# Patient Record
Sex: Male | Born: 1995 | Race: White | Hispanic: No | Marital: Single | Smoking: Never smoker
Health system: Southern US, Community
[De-identification: ages and names within clinical notes are randomized; demographics above are authoritative.]

## PROBLEM LIST (undated history)

## (undated) HISTORY — PX: WISDOM TOOTH EXTRACTION: SHX21

---
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
## 2016-02-07 ENCOUNTER — Emergency Department
Admission: EM | Admit: 2016-02-07 | Discharge: 2016-02-07 | Disposition: A | Discharge status: Home / Self Care | Payer: BLUE CROSS/BLUE SHIELD | Attending: Emergency Medicine | Admitting: Emergency Medicine

## 2016-02-07 ENCOUNTER — Encounter: Payer: Self-pay | Admitting: Emergency Medicine

## 2016-02-07 ENCOUNTER — Emergency Department: Payer: BLUE CROSS/BLUE SHIELD

## 2016-02-07 DIAGNOSIS — S9304XA Dislocation of right ankle joint, initial encounter: Secondary | ICD-10-CM | POA: Insufficient documentation

## 2016-02-07 DIAGNOSIS — Y9289 Other specified places as the place of occurrence of the external cause: Secondary | ICD-10-CM | POA: Diagnosis not present

## 2016-02-07 DIAGNOSIS — S99911A Unspecified injury of right ankle, initial encounter: Secondary | ICD-10-CM | POA: Diagnosis present

## 2016-02-07 DIAGNOSIS — Y998 Other external cause status: Secondary | ICD-10-CM | POA: Diagnosis not present

## 2016-02-07 DIAGNOSIS — W010XXA Fall on same level from slipping, tripping and stumbling without subsequent striking against object, initial encounter: Secondary | ICD-10-CM | POA: Diagnosis not present

## 2016-02-07 DIAGNOSIS — W19XXXA Unspecified fall, initial encounter: Secondary | ICD-10-CM

## 2016-02-07 DIAGNOSIS — Y9301 Activity, walking, marching and hiking: Secondary | ICD-10-CM | POA: Diagnosis not present

## 2016-02-07 MED ORDER — SODIUM CHLORIDE 0.9 % IV BOLUS (SEPSIS)
1000.0000 mL | Freq: Once | INTRAVENOUS | Status: AC
  Administered 2016-02-07: 1000 mL via INTRAVENOUS

## 2016-02-07 MED ORDER — KETAMINE HCL 10 MG/ML IJ SOLN
INTRAMUSCULAR | Status: AC
  Filled 2016-02-07: qty 1

## 2016-02-07 MED ORDER — KETAMINE HCL 10 MG/ML IJ SOLN
INTRAMUSCULAR | Status: AC | PRN
  Administered 2016-02-07: 100 mg via INTRAVENOUS

## 2016-02-07 MED ORDER — KETAMINE HCL 10 MG/ML IJ SOLN: 100.0000 mg | Freq: Once | INTRAMUSCULAR | Status: DC

## 2016-02-07 MED ORDER — NALOXONE HCL 2 MG/2ML IJ SOSY
PREFILLED_SYRINGE | INTRAMUSCULAR | Status: AC
  Filled 2016-02-07: qty 2

## 2016-02-07 NOTE — ED Notes (Signed)
Pt via POV with CO right ankle pain. Pt brought to room from XR.  obvious swelling and deformity to right ankle, foot bent medial at 45angle.  Pt calm and cooperative, 8/10 pain.  Denies ETOH and drug abuse.

## 2016-02-07 NOTE — ED Provider Notes (Signed)
[facility] Emergency Department Provider Note  ____________________________________________  Time seen: Seen upon arrival to the treatment area  I have reviewed the triage vital signs and the nursing notes.   HISTORY  Chief Complaint Ankle Pain    HPI LENELL LAMA is a 20 y.o. male without any chronic medical problems was presenting today with right ankle pain and deformity. He says he was walking down a hill when he tripped. He says that he then noticed the deformity was unable to walk. He came directly to the emergency department. He says there is pain only when he moves or attempts to move the ankle. He says that he is able to feel the foot and move his toes. He says last time he ate was 520.Denies any drinking or other substance abuse tonight.   No past medical history on file.  There are no active problems to display for this patient.   Past Surgical History  Procedure Laterality Date  . Wisdom tooth extraction      No current outpatient prescriptions on file.  Allergies Review of patient's allergies indicates no known allergies.  No family history on file.  Social History Social History  Substance Use Topics  . Smoking status: Never Smoker   . Smokeless tobacco: Not on file  . Alcohol Use: No    Review of Systems Constitutional: No fever/chills Eyes: No visual changes. ENT: No sore throat. Cardiovascular: Denies chest pain. Respiratory: Denies shortness of breath. Gastrointestinal: No abdominal pain.  No nausea, no vomiting.  No diarrhea.  No constipation. Genitourinary: Negative for dysuria. Musculoskeletal: Negative for back pain. Skin: Negative for rash. Neurological: Negative for headaches, focal weakness or numbness.  10-point ROS otherwise negative.  ____________________________________________   PHYSICAL EXAM:  VITAL SIGNS: ED Triage Vitals  Enc Vitals Group     BP 02/07/16 2002 148/79 mmHg     Pulse Rate  02/07/16 2002 51     Resp 02/07/16 2002 18     Temp 02/07/16 2002 98.4 F (36.9 C)     Temp Source 02/07/16 2002 Oral     SpO2 02/07/16 2002 100 %     Weight 02/07/16 2002 180 lb (81.647 kg)     Height 02/07/16 2002  (1.803 m)     Head Cir --      Peak Flow --      Pain Score 02/07/16 2004 8     Pain Loc --      Pain Edu? --      Excl. in GC? --     Constitutional: Alert and oriented. Well appearing and in no acute distress. Eyes: Conjunctivae are normal. PERRL. EOMI. Head: Atraumatic. Nose: No congestion/rhinnorhea. Mouth/Throat: Mucous membranes are moist.   Neck: No stridor.   Cardiovascular: Normal rate, regular rhythm. Grossly normal heart sounds.  Good peripheral circulation. Respiratory: Normal respiratory effort.  No retractions. Lungs CTAB. Gastrointestinal: Soft and nontender. No distention. No abdominal bruits. No CVA tenderness. Musculoskeletal: Obvious deformity to the right lower extremity with the lateral malleolus protruding greatly and the foot displaced medially to the tibia. There is a palpable dorsalis pedis pulse. He is sensate to light touch. He is able to range his toes fully. Neurologic:  Normal speech and language. No gross focal neurologic deficits are appreciated.  Skin:  Skin is warm, dry and intact. No rash noted. Psychiatric: Mood and affect are normal. Speech and behavior are normal.  ____________________________________________   LABS (all labs ordered are listed, but  only abnormal results are displayed)  Labs Reviewed - No data to display ____________________________________________  EKG   ____________________________________________  RADIOLOGY  DG Ankle Complete Right (Final result) Result time: 02/07/16 20:47:00   Final result by Rad Results In Interface (02/07/16 20:47:00)   Narrative:   CLINICAL DATA: Status post reduction  EXAM: RIGHT ANKLE - COMPLETE 3+ VIEW  COMPARISON: Films from earlier in the same  day  FINDINGS: Considerable soft tissue swelling is noted. There is been relocation of the talus into the navicular bone. No definitive fracture is seen. No other focal abnormality is noted.  IMPRESSION: Reduction of previously seen talus dislocation   Electronically Signed By: Alcide Clever M.D. On: 02/07/2016 20:47          DG Ankle Complete Right (Final result) Result time: 02/07/16 19:44:10   Final result by Rad Results In Interface (02/07/16 19:44:10)   Narrative:   CLINICAL DATA: Slipped and fell while walking down hill, with right ankle pain and deformity. Initial encounter.  EXAM: RIGHT ANKLE - COMPLETE 3+ VIEW  COMPARISON: None.  FINDINGS: There is complete dislocation distal to the talus, involving the talocalcaneal and talonavicular articulations. There is marked displacement of the foot medially, with outward rotation. No definite fracture is seen.  The ankle mortise is grossly unremarkable, though incompletely assessed. The interosseous space is preserved. Known soft disruption is not well characterized on radiograph.  IMPRESSION: Complete dislocation distal to the talus, involving the talocalcaneal and talonavicular articulations. Marked displacement of the foot medially, with outward rotation. No definite fracture characterized on radiograph.   Electronically Signed By: Roanna Raider M.D. On: 02/07/2016 19:43        ____________________________________________   PROCEDURES  Procedural sedation Performed by: Arelia Longest Consent: Verbal consent obtained. Risks and benefits: risks, benefits and alternatives were discussed Required items: required blood products, implants, devices, and special equipment available Patient identity confirmed: arm band and provided demographic data Time out: Immediately prior to procedure a "time out" was called to verify the correct patient, procedure, equipment, support staff and  site/side marked as required.  Sedation type: moderate (conscious) sedation NPO time confirmed and considedered  Sedatives: KETAMINE   Physician Time at Bedside:  Vitals: Vital signs were monitored during sedation. Cardiac Monitor, pulse oximeter Patient tolerance: Patient tolerated the procedure well with no immediate complications. Comments: Pt with uneventful recovered. Returned to pre-procedural sedation baseline  Reduction of dislocation Date/Time: 9:17 PM Performed by: Arelia Longest Authorized by: Arelia Longest Consent: Verbal consent obtained. Risks and benefits: risks, benefits and alternatives were discussed Consent given by: patient Required items: required blood products, implants, devices, and special equipment available Time out: Immediately prior to procedure a "time out" was called to verify the correct patient, procedure, equipment, support staff and site/side marked as required.  Patient sedated: With ketamine  Vitals: Vital signs were monitored during sedation. Patient tolerance: Patient tolerated the procedure well with no immediate complications. Joint: Right ankle Reduction technique: Gentle axial and lateral traction.      ____________________________________________   INITIAL IMPRESSION / ASSESSMENT AND PLAN / ED COURSE  Pertinent labs & imaging results that were available during my care of the patient were reviewed by me and considered in my medical decision making (see chart for details).  ----------------------------------------- 9:18 PM on 02/07/2016 -----------------------------------------  Patient now in posterior as well as U-splint. Neurovascularly intact with less than 2 second capillary refill as well as sensation to light touch. The patient is  able to range his toes fully. He says that his pain is greatly improved. He'll be discharged with crutches. He knows to follow up with  orthopedics. ____________________________________________   FINAL CLINICAL IMPRESSION(S) / ED DIAGNOSES  Mechanical fall. Right ankle dislocation.    Myrna Blazeravid Matthew Schaevitz, MD 02/07/16 2119

## 2016-02-07 NOTE — Discharge Instructions (Signed)
Ankle Dislocation °Ankle dislocation is the displacement of the bones that form your ankle joint. The ankle joint is designed for a balance of stability and flexibility. The bones of the ankle are held in place by very strong, fibrous tissues (ligaments) that connect the bones to each other. °CAUSES °Because the ankle is a very strong and stable joint, ankle dislocation is only caused by a very forceful injury. Typically, injuries that contribute to ankle dislocation include broken bones (fractures) on the inside and outside of the ankle (malleoli).  °RELATED COMPLICATIONS °Ankle dislocation can lead to more serious complications. Examples of complications associated with ankle dislocation include: °· Injury to the strong fibrous tissues that connect muscles to bones (tendons). °· Injury to the flexible tissue that cushions the bones in the joint (cartilage). This can lead to the development of arthritis, loss of joint motion, and pain. °· Injury to the nerves and blood vessels that cross the ankle. Blood vessel damage may result in bone death of the top bone of the foot (talus). °· Skin over the dislocated area being torn (lacerated) or damaged by pressure from the dislocated bones. °· Swelling of compartments in the foot (rare). This may damage blood supply to the muscles (compartment syndrome). °RISK FACTORS °Although dislocation of the ankle can occur in anyone, some people are at greater risk than others. People at increased risk of ankle dislocation include: °· Young males. This may be related to their overall increased risk of injury. °· Postmenopausal women. This may be related to their increased risk of bone fracture because of the weakening of the bones that occurs in women in this age group (osteoporosis). °· People born with greater looseness (elasticity) in their ligaments. °SYMPTOMS °Symptoms of ankle dislocation include: °· Severe pain. °· Swelling. °· Deformity around the ankle. °· Whitening or  laceration of the skin. °DIAGNOSIS  °A physical exam and an X-ray exam are usually done to help your caregiver diagnose ankle dislocation. °TREATMENT °Treatment may include: °· Manipulation of the ankle by your caregiver to put your ankle back in place (reduction). °· Repair of any associated skin lacerations. °· Plates and screws used to stabilize the fractures and hold the joint in position after reduction. °· Pins drilled into your bones that are connected to bars outside of your skin (external fixator) used to hold your ankle in a fixed position until the swelling in your ankle goes down enough for surgery to be done. °· Placement of a cast or splint to allow torn ligaments to heal. °· Physical therapy to regain ankle motion and leg strength. °HOME CARE INSTRUCTIONS °The following measures can help to reduce pain and hasten the healing process: °· Rest your injured joint. Do not move it. Avoid activities similar to the one that caused your injury. °· Apply ice to your injured joint for 1 to 2 days after your reduction or as directed by your caregiver. Applying ice helps to reduce inflammation and pain. °¨ Put ice in a plastic bag. °¨ Place a towel between your skin and the bag. °¨ Leave the ice on for 15 to 20 minutes at a time, every couple of hours while you are awake. °· Elevate your ankle above your heart to minimize swelling. °· Move your toes as instructed by your caregiver to prevent stiffness. °· Take over-the-counter or prescription medicines for pain as directed by your caregiver. °SEEK IMMEDIATE MEDICAL CARE IF: °· Your cast, splint, screws, plates, or external fixator becomes loose or damaged. °·   You have an external fixator and you notice fluids draining around the pins. °· Your pain becomes worse rather than better. °· You lose feeling in your toe or cannot bend the tip of your toe. °MAKE SURE YOU: °· Understand these instructions. °· Will watch your condition. °· Will get help right away if you  are not doing well or get worse. °  °This information is not intended to replace advice given to you by your health care provider. Make sure you discuss any questions you have with your health care provider. °  °Document Released: 11/09/2005 Document Revised: 11/30/2014 Document Reviewed: 06/11/2015 °[employer] Interactive Patient Education ©2016 [employer] ° °

## 2019-10-13 ENCOUNTER — Other Ambulatory Visit: Payer: Self-pay

## 2019-10-13 DIAGNOSIS — Z20822 Contact with and (suspected) exposure to covid-19: Secondary | ICD-10-CM

## 2019-10-15 ENCOUNTER — Telehealth: Payer: Self-pay

## 2019-10-15 NOTE — Telephone Encounter (Signed)
Pt called for covid test results. Advised results are not back.  

## 2019-10-16 LAB — NOVEL CORONAVIRUS, NAA: SARS-CoV-2, NAA: NOT DETECTED

## 2021-10-31 ENCOUNTER — Emergency Department: Payer: BC Managed Care – PPO

## 2021-10-31 ENCOUNTER — Emergency Department
Admission: EM | Admit: 2021-10-31 | Discharge: 2021-11-01 | Disposition: A | Discharge status: Home / Self Care | Payer: BC Managed Care – PPO | Attending: Emergency Medicine | Admitting: Emergency Medicine

## 2021-10-31 ENCOUNTER — Other Ambulatory Visit: Payer: Self-pay

## 2021-10-31 DIAGNOSIS — S4991XA Unspecified injury of right shoulder and upper arm, initial encounter: Secondary | ICD-10-CM | POA: Diagnosis present

## 2021-10-31 DIAGNOSIS — Y900 Blood alcohol level of less than 20 mg/100 ml: Secondary | ICD-10-CM | POA: Insufficient documentation

## 2021-10-31 DIAGNOSIS — S060XAA Concussion with loss of consciousness status unknown, initial encounter: Secondary | ICD-10-CM | POA: Diagnosis not present

## 2021-10-31 DIAGNOSIS — Z23 Encounter for immunization: Secondary | ICD-10-CM | POA: Diagnosis not present

## 2021-10-31 DIAGNOSIS — T1490XA Injury, unspecified, initial encounter: Secondary | ICD-10-CM

## 2021-10-31 DIAGNOSIS — S40211A Abrasion of right shoulder, initial encounter: Secondary | ICD-10-CM | POA: Diagnosis not present

## 2021-10-31 LAB — CBC
HCT: 39.4 % (ref 39.0–52.0)
Hemoglobin: 13.7 g/dL (ref 13.0–17.0)
MCH: 31.2 pg (ref 26.0–34.0)
MCHC: 34.8 g/dL (ref 30.0–36.0)
MCV: 89.7 fL (ref 80.0–100.0)
Platelets: 245 10*3/uL (ref 150–400)
RBC: 4.39 MIL/uL (ref 4.22–5.81)
RDW: 12 % (ref 11.5–15.5)
WBC: 7.3 10*3/uL (ref 4.0–10.5)
nRBC: 0 % (ref 0.0–0.2)

## 2021-10-31 LAB — COMPREHENSIVE METABOLIC PANEL
ALT: 31 U/L (ref 0–44)
AST: 43 U/L — ABNORMAL HIGH (ref 15–41)
Albumin: 4.7 g/dL (ref 3.5–5.0)
Alkaline Phosphatase: 53 U/L (ref 38–126)
Anion gap: 8 (ref 5–15)
BUN: 18 mg/dL (ref 6–20)
CO2: 29 mmol/L (ref 22–32)
Calcium: 9.3 mg/dL (ref 8.9–10.3)
Chloride: 100 mmol/L (ref 98–111)
Creatinine, Ser: 0.81 mg/dL (ref 0.61–1.24)
GFR, Estimated: >60 mL/min (ref >60)
Glucose, Bld: 94 mg/dL (ref 70–99)
Potassium: 3.6 mmol/L (ref 3.5–5.1)
Sodium: 137 mmol/L (ref 135–145)
Total Bilirubin: 1.8 mg/dL — ABNORMAL HIGH (ref 0.3–1.2)
Total Protein: 7.5 g/dL (ref 6.5–8.1)

## 2021-10-31 LAB — PROTIME-INR
INR: 1 (ref 0.8–1.2)
Prothrombin Time: 13.5 seconds (ref 11.4–15.2)

## 2021-10-31 MED ORDER — FENTANYL CITRATE PF 50 MCG/ML IJ SOSY
50.0000 ug | PREFILLED_SYRINGE | Freq: Once | INTRAMUSCULAR | Status: AC
  Administered 2021-10-31: 50 ug via INTRAVENOUS
  Filled 2021-10-31: qty 1

## 2021-10-31 MED ORDER — ONDANSETRON HCL 4 MG/2ML IJ SOLN
4.0000 mg | Freq: Once | INTRAMUSCULAR | Status: AC
  Administered 2021-10-31: 4 mg via INTRAVENOUS
  Filled 2021-10-31: qty 2

## 2021-10-31 MED ORDER — SODIUM CHLORIDE 0.9 % IV BOLUS
1000.0000 mL | Freq: Once | INTRAVENOUS | Status: AC
  Administered 2021-11-01: 1000 mL via INTRAVENOUS

## 2021-10-31 MED ORDER — SODIUM CHLORIDE 0.9 % IV SOLN: INTRAVENOUS | Status: DC

## 2021-10-31 MED ORDER — TETANUS-DIPHTH-ACELL PERTUSSIS 5-2.5-18.5 LF-MCG/0.5 IM SUSY
0.5000 mL | PREFILLED_SYRINGE | Freq: Once | INTRAMUSCULAR | Status: AC
  Administered 2021-10-31: 0.5 mL via INTRAMUSCULAR
  Filled 2021-10-31: qty 0.5
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
## 2021-11-01 ENCOUNTER — Emergency Department: Payer: BC Managed Care – PPO

## 2021-11-01 ENCOUNTER — Encounter: Payer: Self-pay | Admitting: Radiology

## 2021-11-01 LAB — SAMPLE TO BLOOD BANK

## 2021-11-01 LAB — URINE DRUG SCREEN, QUALITATIVE (ARMC ONLY)
Amphetamines, Ur Screen: NOT DETECTED
Barbiturates, Ur Screen: NOT DETECTED
Benzodiazepine, Ur Scrn: NOT DETECTED
Cannabinoid 50 Ng, Ur ~~LOC~~: POSITIVE — AB
Cocaine Metabolite,Ur ~~LOC~~: POSITIVE — AB
MDMA (Ecstasy)Ur Screen: NOT DETECTED
Methadone Scn, Ur: NOT DETECTED
Opiate, Ur Screen: NOT DETECTED
Phencyclidine (PCP) Ur S: NOT DETECTED
Tricyclic, Ur Screen: NOT DETECTED

## 2021-11-01 LAB — ETHANOL: Alcohol, Ethyl (B): <10 mg/dL (ref <10)

## 2021-11-01 MED ORDER — IOHEXOL 300 MG/ML  SOLN
100.0000 mL | Freq: Once | INTRAMUSCULAR | Status: AC | PRN
  Administered 2021-11-01: 100 mL via INTRAVENOUS
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
## 2023-05-13 IMAGING — CT CT HEAD W/O CM
4 series · 16 of 47 positions shown, 18 images · non-contrast
Comparison: None.

CLINICAL DATA: Trauma.

EXAM:
CT HEAD WITHOUT CONTRAST
CT CERVICAL SPINE WITHOUT CONTRAST
TECHNIQUE: Multidetector CT imaging of the head and cervical spine was
performed following the standard protocol without intravenous
contrast. Multiplanar CT image reconstructions of the cervical spine
were also generated.

[Series 2: head wo · axial · 0.43mm/px · z∈[-75,+45]mm · 7 of 33 slices shown, 9 images]
[im 5/33  brain]
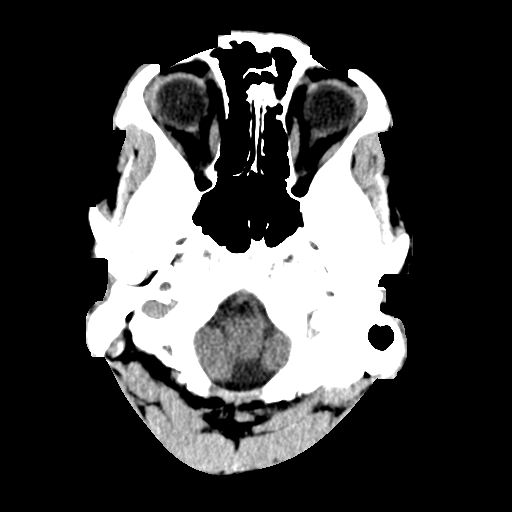
[im 5/33  bone]
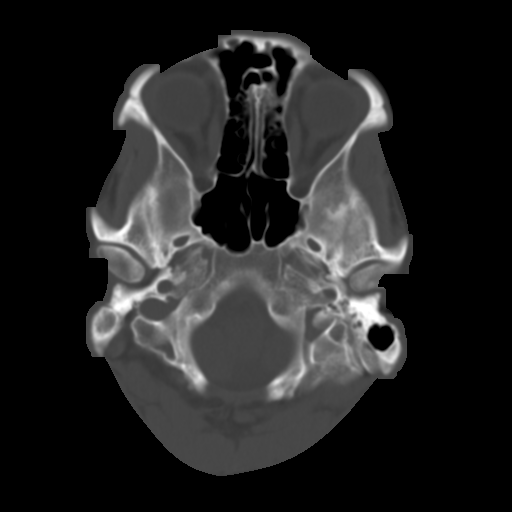
[im 9/33  brain]
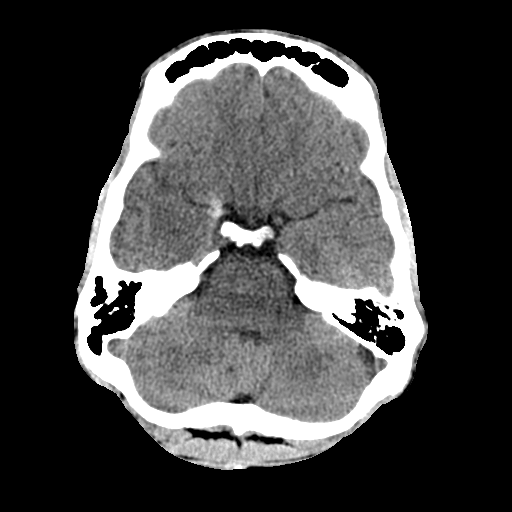
[im 13/33  brain]
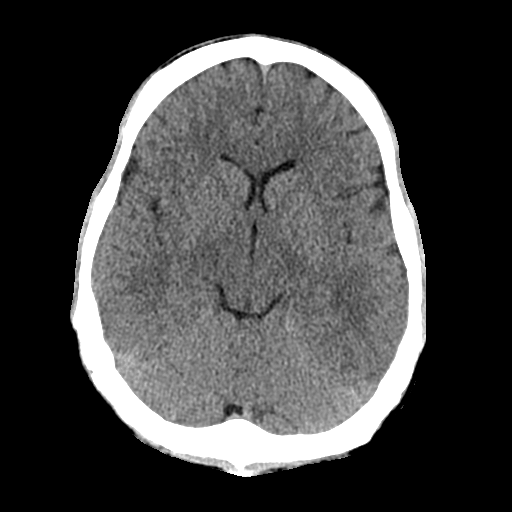
[im 17/33  brain]
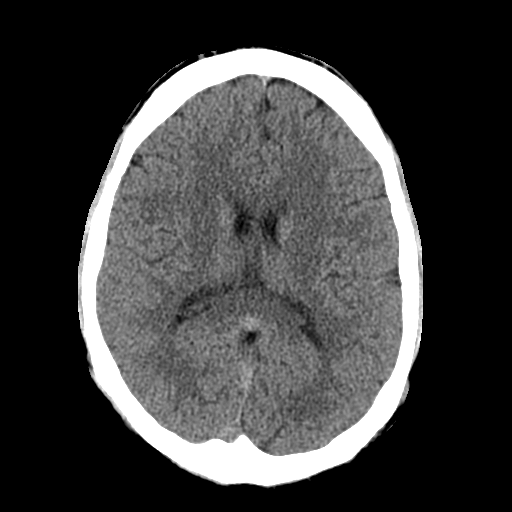
[im 21/33  brain]
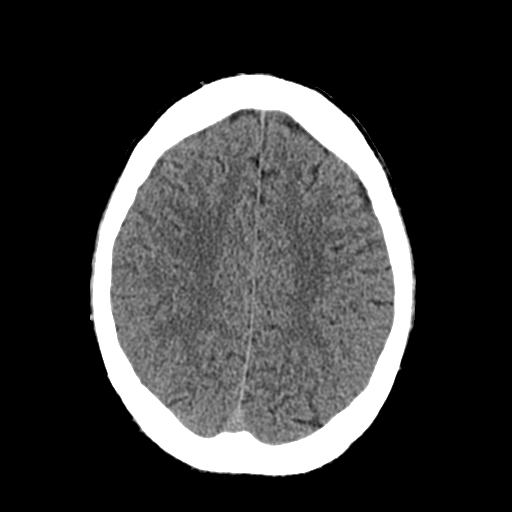
[im 21/33  bone]
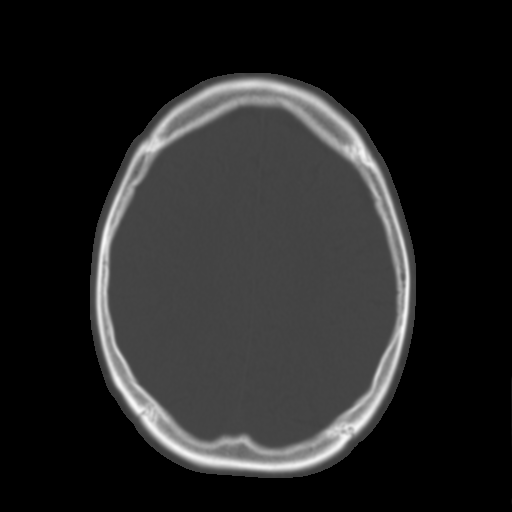
[im 25/33  brain]
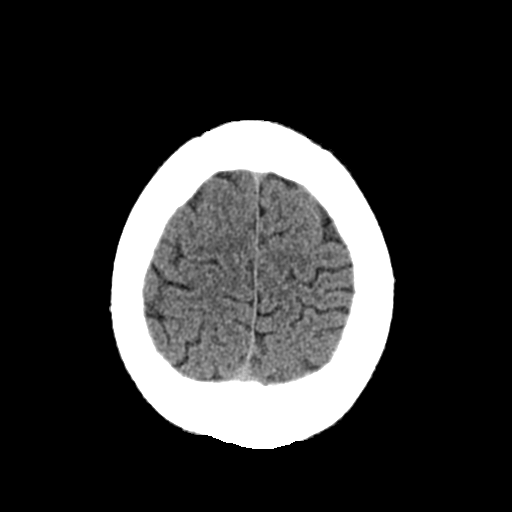
[im 29/33  brain]
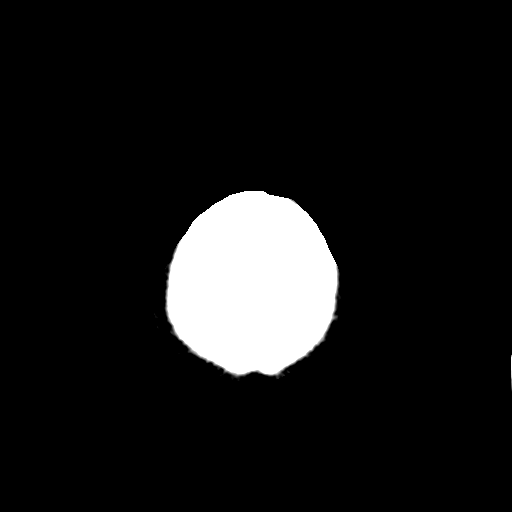

[Series 3: head bone · axial · 0.43mm/px · z∈[-79,-47]mm · 3 of 82 slices shown]
[im 9/82  bone]
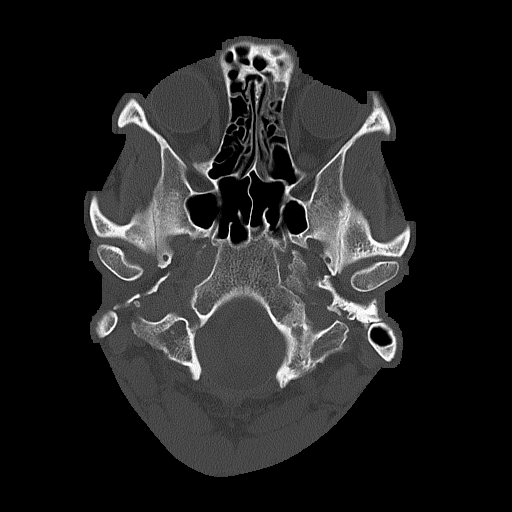
[im 17/82  bone]
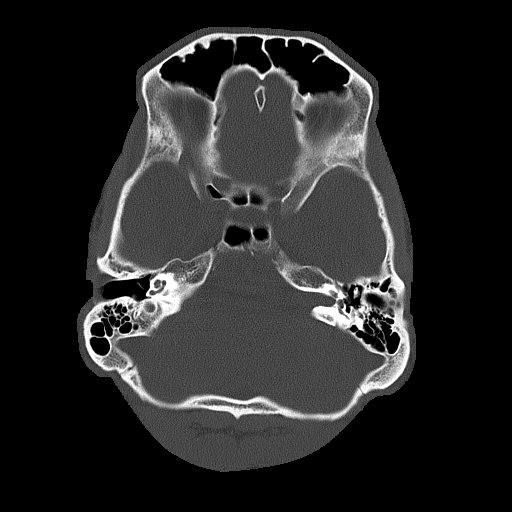
[im 25/82  bone]
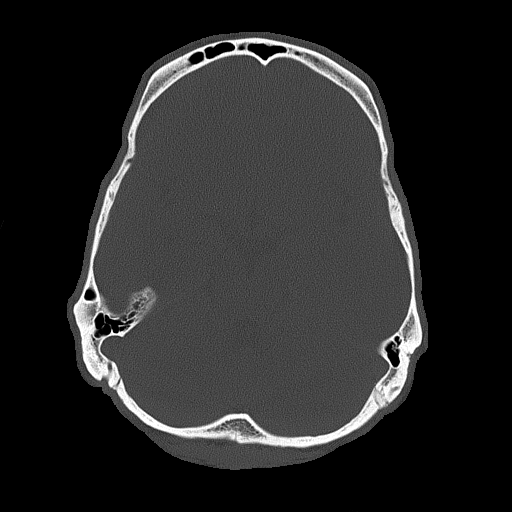

[Series 4: coronal soft tissue · coronal · 0.37mm/px · 3 of 80 slices shown]
[im 27/80  brain]
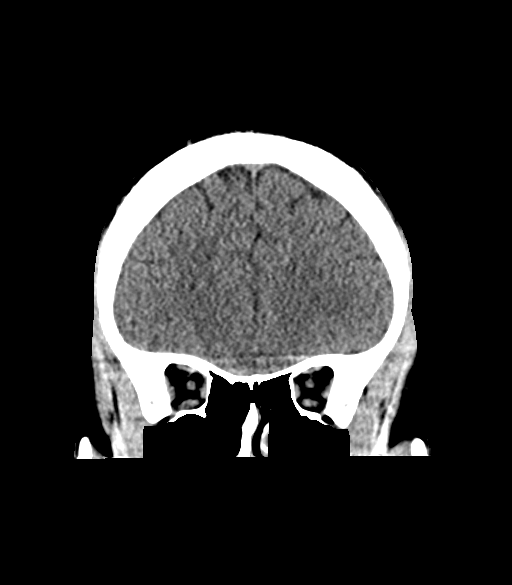
[im 36/80  brain]
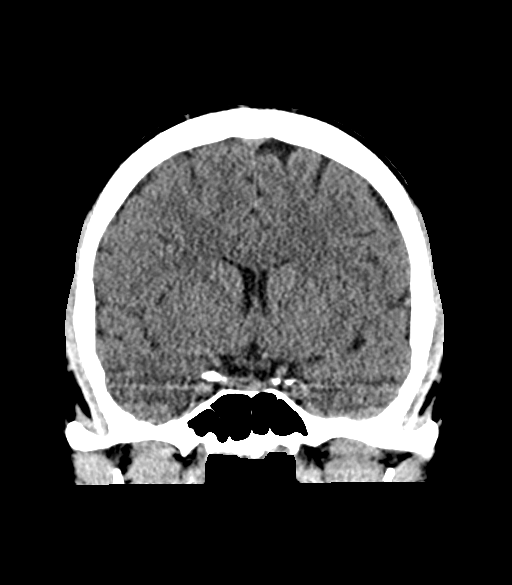
[im 44/80  brain]
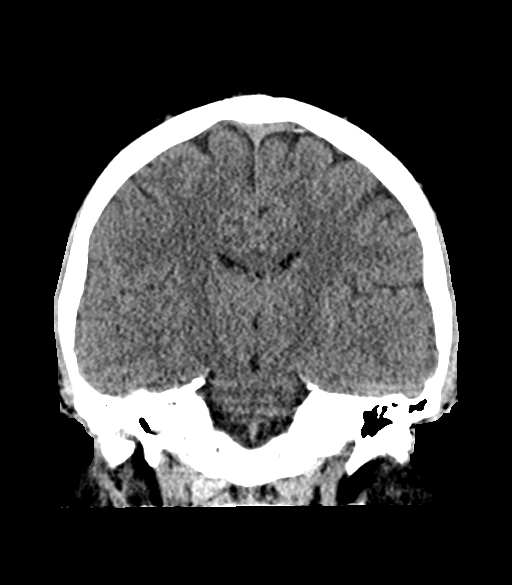

[Series 5: sagittal soft tissue · sagittal · 0.35mm/px · 3 of 63 slices shown]
[im 21/63  brain]
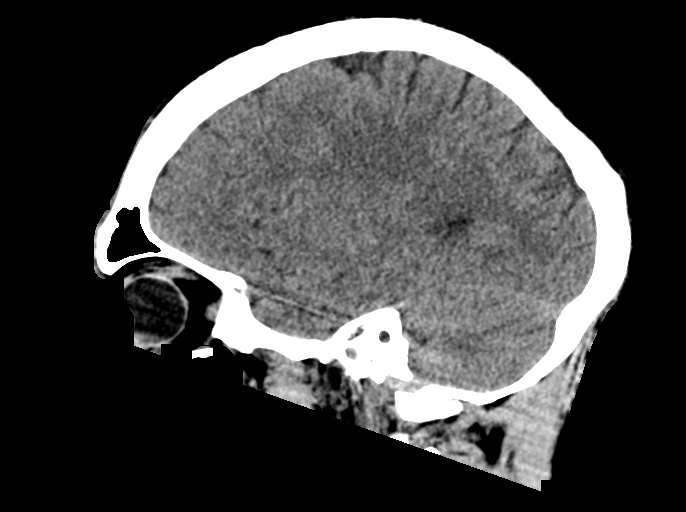
[im 32/63  brain]
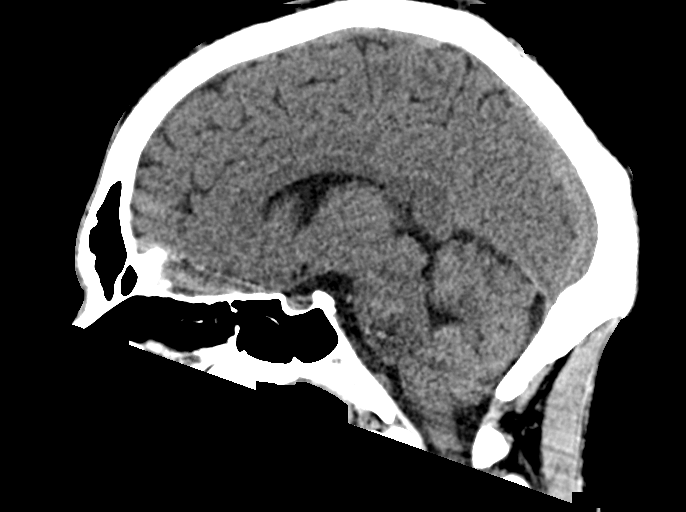
[im 42/63  brain]
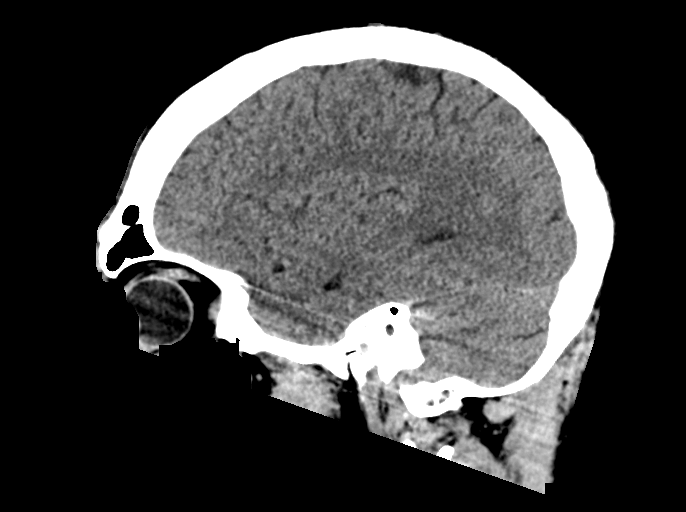

[16 of 47 positions shown; findings below may reference images not displayed]

FINDINGS: CT HEAD FINDINGS

Brain: The ventricles and sulci are appropriate size for the
patient's age. The gray-white matter discrimination is preserved.
There is no acute intracranial hemorrhage. No mass effect or midline
shift. No extra-axial fluid collection.

Vascular: No hyperdense vessel or unexpected calcification.

Skull: Normal. Negative for fracture or focal lesion.

Sinuses/Orbits: There is mucoperiosteal thickening of paranasal
sinuses. No air-fluid level. The mastoid air cells are clear.

Other: None

CT CERVICAL SPINE FINDINGS

Alignment: No acute subluxation. There is straightening of normal
cervical lordosis which may be positional or due to muscle spasm.

Skull base and vertebrae: No acute fracture.

Soft tissues and spinal canal: No prevertebral fluid or swelling. No
visible canal hematoma.

Disc levels:  No acute findings.

Upper chest: Negative.

Other: None
IMPRESSION: 1. Normal noncontrast CT of the brain.
2. No acute/traumatic cervical spine pathology.
